# Patient Record
Sex: Female | Born: 1990
Health system: Southern US, Community
[De-identification: ages and names within clinical notes are randomized; demographics above are authoritative.]

---

## 2011-04-04 ENCOUNTER — Emergency Department (HOSPITAL_COMMUNITY)
Admission: EM | Admit: 2011-04-04 | Discharge: 2011-04-05 | Disposition: A | Discharge status: Home / Self Care | Payer: PRIVATE HEALTH INSURANCE | Attending: Emergency Medicine | Admitting: Emergency Medicine

## 2011-04-04 DIAGNOSIS — R55 Syncope and collapse: Secondary | ICD-10-CM | POA: Insufficient documentation

## 2011-04-04 LAB — GLUCOSE, CAPILLARY: Glucose-Capillary: 103 mg/dL — ABNORMAL HIGH (ref 70–99)

## 2011-04-05 ENCOUNTER — Emergency Department (HOSPITAL_COMMUNITY): Payer: PRIVATE HEALTH INSURANCE

## 2011-04-05 LAB — URINALYSIS, ROUTINE W REFLEX MICROSCOPIC
Bilirubin Urine: NEGATIVE
Glucose, UA: NEGATIVE mg/dL
Hgb urine dipstick: NEGATIVE
Ketones, ur: NEGATIVE mg/dL
Leukocytes, UA: NEGATIVE
Nitrite: NEGATIVE
Protein, ur: NEGATIVE mg/dL
Specific Gravity, Urine: 1.01 (ref 1.005–1.030)
Urobilinogen, UA: 0.2 mg/dL (ref 0.0–1.0)
pH: 7.5 (ref 5.0–8.0)

## 2011-04-05 LAB — CBC
HCT: 31 % — ABNORMAL LOW (ref 36.0–46.0)
Hemoglobin: 9.5 g/dL — ABNORMAL LOW (ref 12.0–15.0)
MCH: 23.7 pg — ABNORMAL LOW (ref 26.0–34.0)
MCHC: 30.6 g/dL (ref 30.0–36.0)

## 2011-04-05 LAB — DIFFERENTIAL
Lymphocytes Relative: 11 % — ABNORMAL LOW (ref 12–46)
Monocytes Absolute: 0.7 10*3/uL (ref 0.1–1.0)
Monocytes Relative: 6 % (ref 3–12)
Neutro Abs: 9.9 10*3/uL — ABNORMAL HIGH (ref 1.7–7.7)

## 2011-04-05 LAB — BASIC METABOLIC PANEL
BUN: 5 mg/dL — ABNORMAL LOW (ref 6–23)
CO2: 26 mEq/L (ref 19–32)
Calcium: 9.8 mg/dL (ref 8.4–10.5)
GFR calc non Af Amer: >90 mL/min (ref >90)
Glucose, Bld: 129 mg/dL — ABNORMAL HIGH (ref 70–99)
Potassium: 3.8 mEq/L (ref 3.5–5.1)

## 2011-04-05 LAB — POCT PREGNANCY, URINE: Preg Test, Ur: NEGATIVE

## 2011-04-05 LAB — RAPID URINE DRUG SCREEN, HOSP PERFORMED
Barbiturates: NOT DETECTED
Benzodiazepines: NOT DETECTED
Tetrahydrocannabinol: NOT DETECTED

## 2013-05-20 IMAGING — CT CT HEAD W/O CM
2 series · 16 of 30 positions shown, 20 images · non-contrast
Comparison: None.

CLINICAL DATA: Syncope versus seizure.

CT HEAD WITHOUT CONTRAST
TECHNIQUE: Contiguous axial images were obtained from the base of
the skull through the vertex without contrast.

[Series 2: head w/o · axial · non-contrast · 0.43mm/px · z∈[-280,-160]mm · 13 of 29 slices shown, 17 images]
[im 3/29  brain]
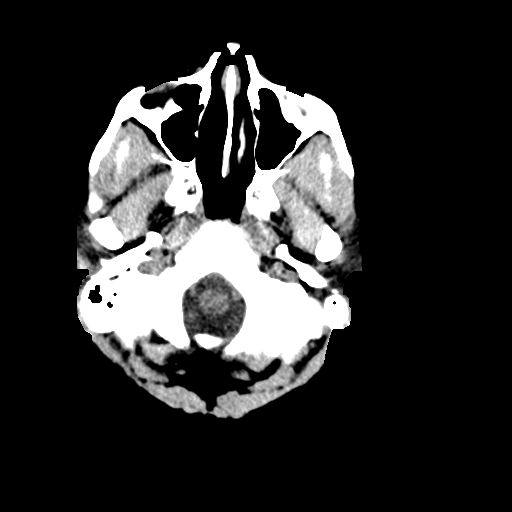
[im 3/29  bone]
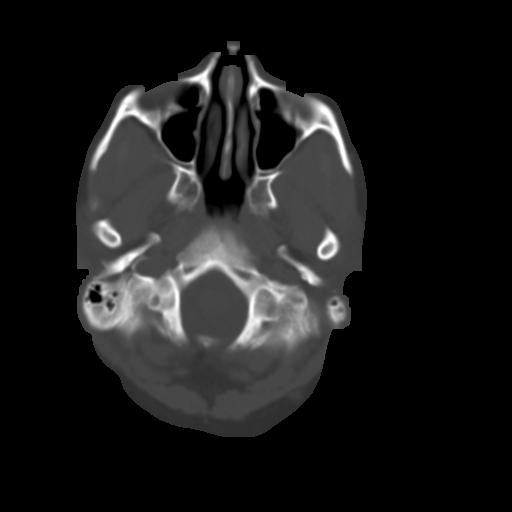
[im 5/29  brain]
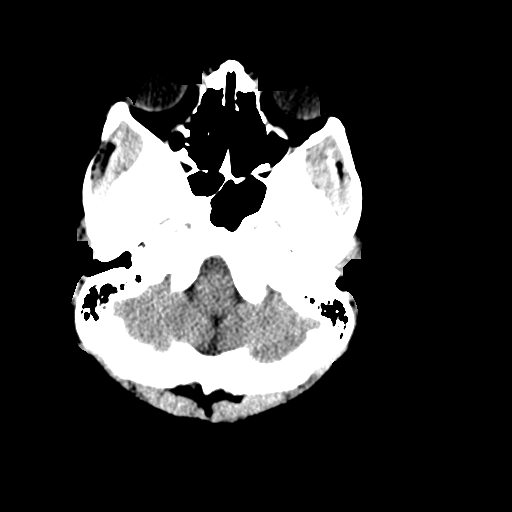
[im 7/29  brain]
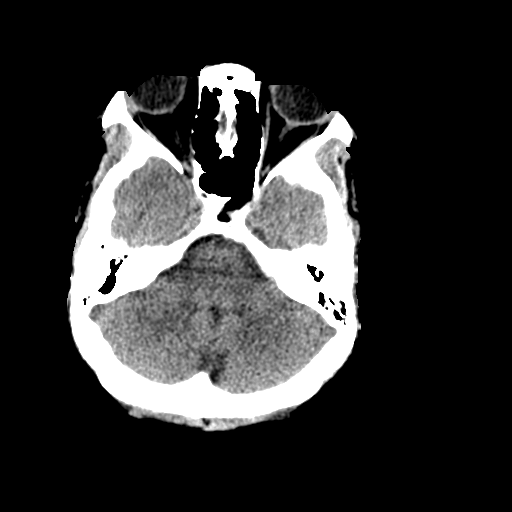
[im 9/29  brain]
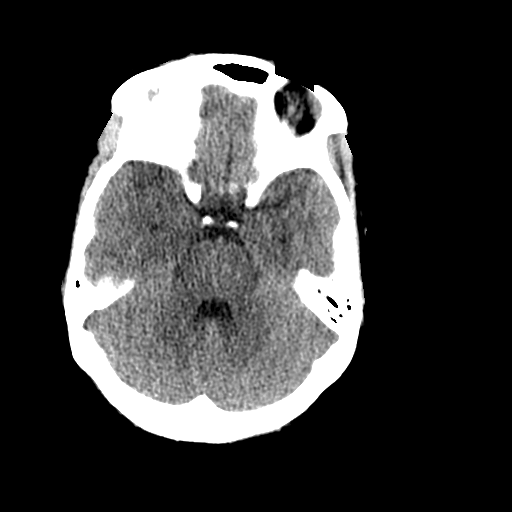
[im 11/29  brain]
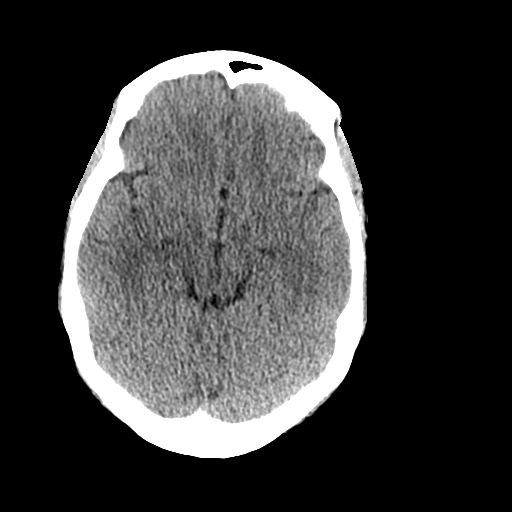
[im 11/29  bone]
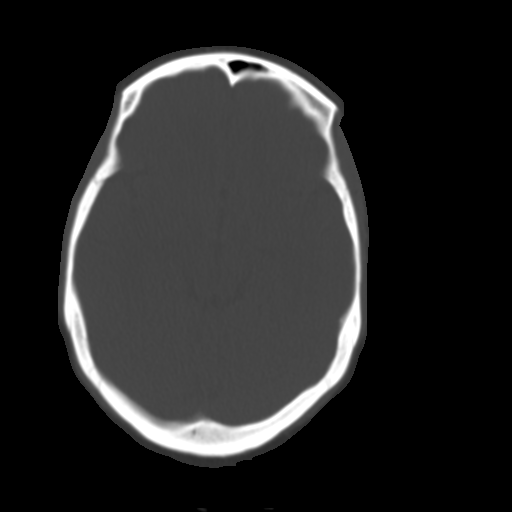
[im 13/29  brain]
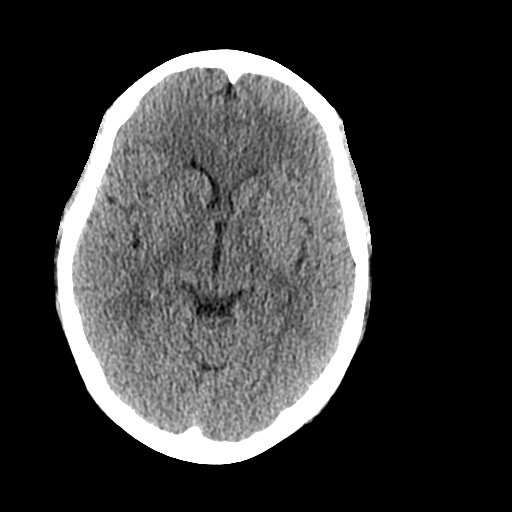
[im 15/29  brain]
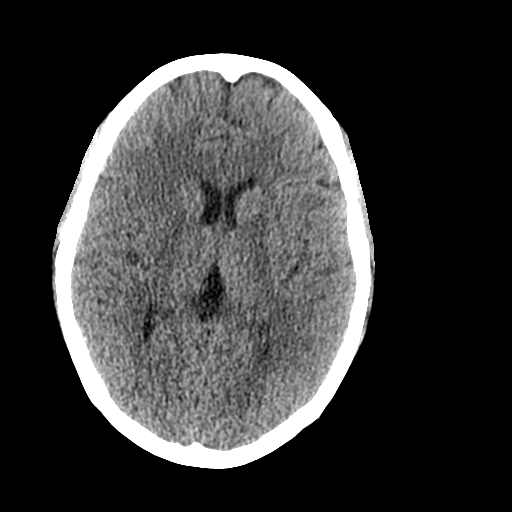
[im 17/29  brain]
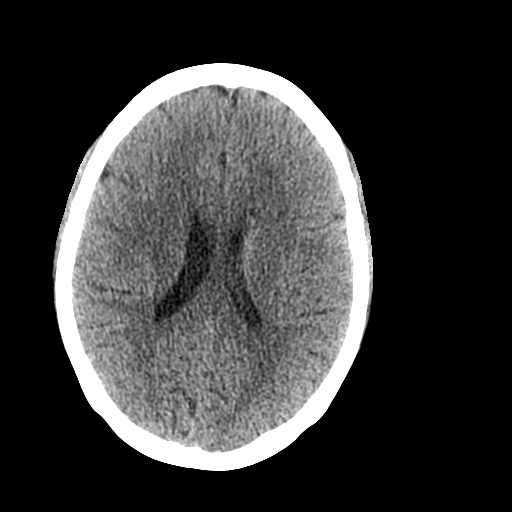
[im 19/29  brain]
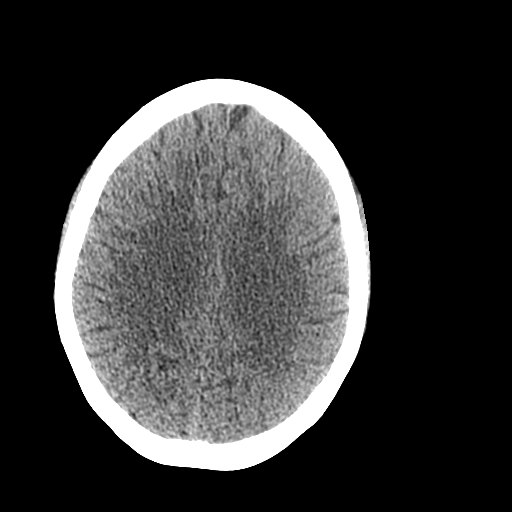
[im 19/29  bone]
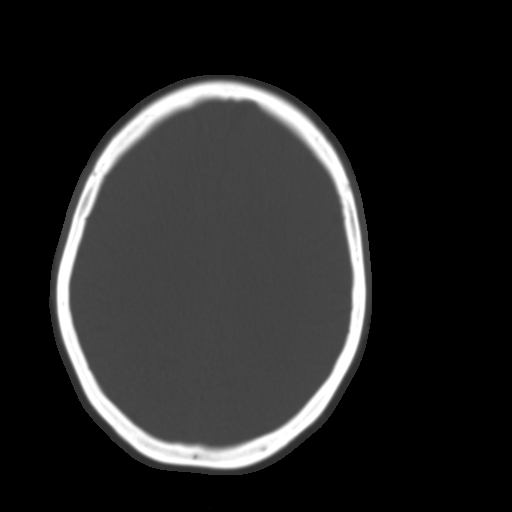
[im 21/29  brain]
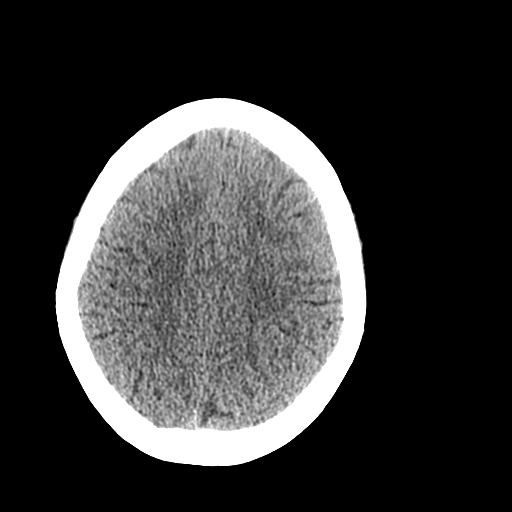
[im 23/29  brain]
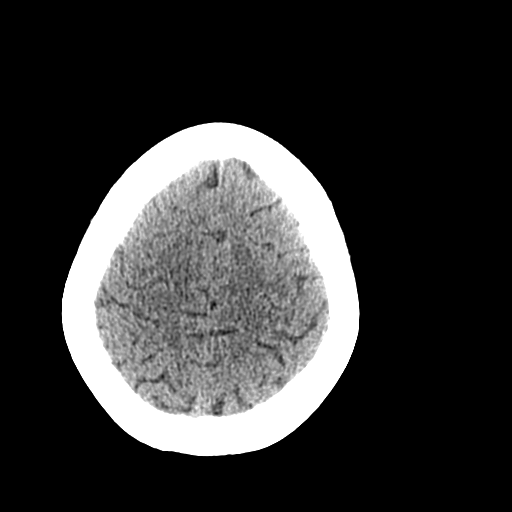
[im 25/29  brain]
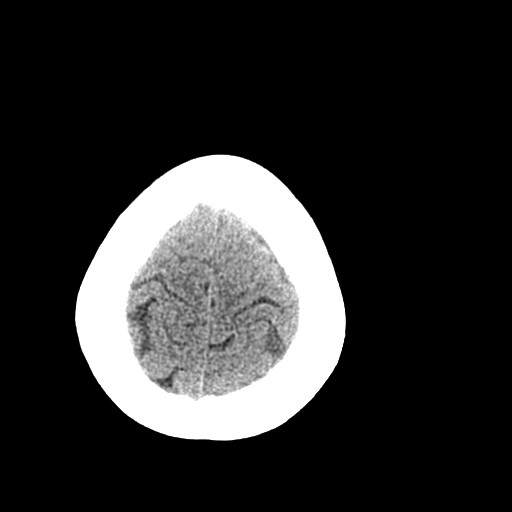
[im 27/29  brain]
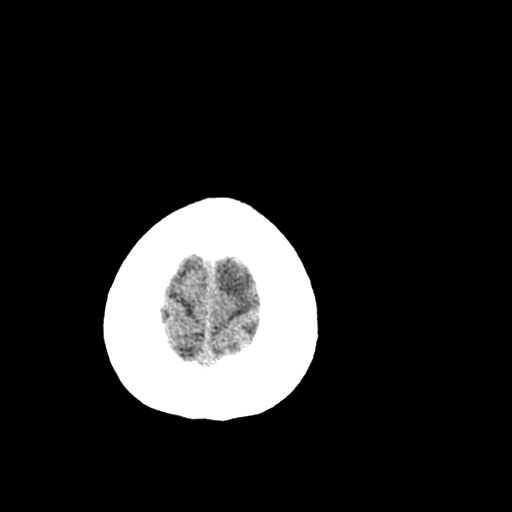
[im 27/29  bone]
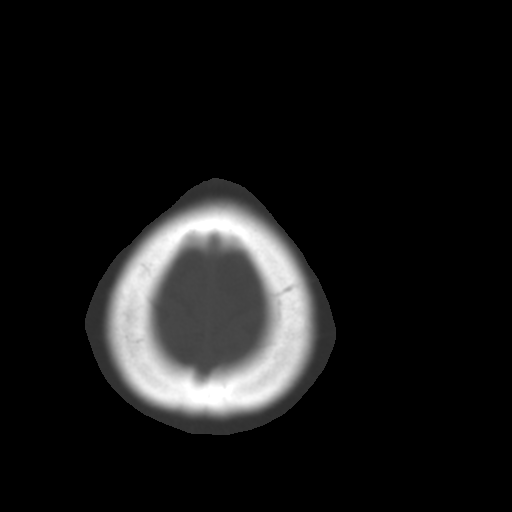

[Series 3: bone windows · axial · 0.43mm/px · z∈[-280,-240]mm · 3 of 29 slices shown]
[im 3/29  bone]
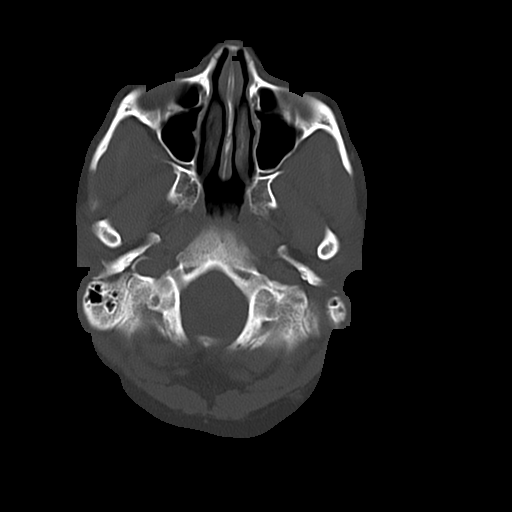
[im 7/29  bone]
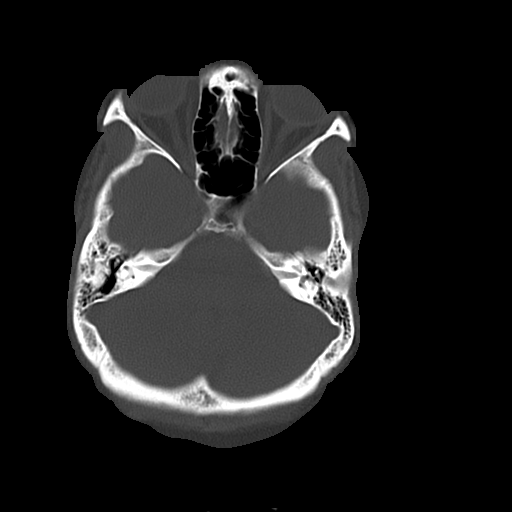
[im 11/29  bone]
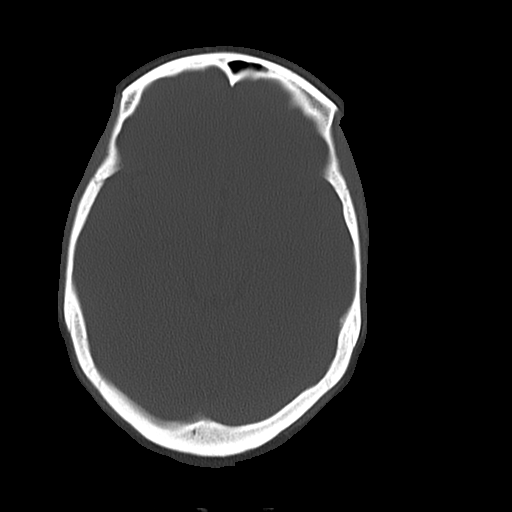

[16 of 30 positions shown; findings below may reference images not displayed]

FINDINGS: There is no evidence for acute hemorrhage, hydrocephalus,
mass lesion, or abnormal extra-axial fluid collection.  No definite
CT evidence for acute infarction.  The visualized paranasal sinuses
and mastoid air cells are predominately clear.
IMPRESSION: No acute intracranial abnormality.

## 2014-12-29 ENCOUNTER — Encounter (HOSPITAL_COMMUNITY): Payer: Self-pay | Admitting: Emergency Medicine

## 2014-12-29 ENCOUNTER — Emergency Department (HOSPITAL_COMMUNITY)
Admission: EM | Admit: 2014-12-29 | Discharge: 2014-12-29 | Disposition: A | Discharge status: Home / Self Care | Payer: PRIVATE HEALTH INSURANCE | Attending: Emergency Medicine | Admitting: Emergency Medicine

## 2014-12-29 DIAGNOSIS — N390 Urinary tract infection, site not specified: Secondary | ICD-10-CM

## 2014-12-29 DIAGNOSIS — Z79899 Other long term (current) drug therapy: Secondary | ICD-10-CM | POA: Insufficient documentation

## 2014-12-29 DIAGNOSIS — Z3202 Encounter for pregnancy test, result negative: Secondary | ICD-10-CM | POA: Insufficient documentation

## 2014-12-29 DIAGNOSIS — R319 Hematuria, unspecified: Secondary | ICD-10-CM | POA: Insufficient documentation

## 2014-12-29 LAB — URINALYSIS, ROUTINE W REFLEX MICROSCOPIC
GLUCOSE, UA: NEGATIVE mg/dL
KETONES UR: NEGATIVE mg/dL
Nitrite: NEGATIVE
PH: 6.5 (ref 5.0–8.0)
Protein, ur: >300 mg/dL — AB
Specific Gravity, Urine: 1.03 (ref 1.005–1.030)
Urobilinogen, UA: 1 mg/dL (ref 0.0–1.0)

## 2014-12-29 LAB — URINE MICROSCOPIC-ADD ON

## 2014-12-29 LAB — POC URINE PREG, ED: Preg Test, Ur: NEGATIVE

## 2014-12-29 MED ORDER — SULFAMETHOXAZOLE-TRIMETHOPRIM 800-160 MG PO TABS: 1.0000 | ORAL_TABLET | Freq: Two times a day (BID) | ORAL | Status: DC

## 2014-12-29 MED ORDER — PHENAZOPYRIDINE HCL 200 MG PO TABS: 200.0000 mg | ORAL_TABLET | Freq: Three times a day (TID) | ORAL | Status: DC

## 2014-12-29 NOTE — Discharge Instructions (Signed)
Read the information below.  Use the prescribed medication as directed.  Please discuss all new medications with your pharmacist.  You may return to the Emergency Department at any time for worsening condition or any new symptoms that concern you.   If you develop high fevers, back or abdominal pain, uncontrolled vomiting, or are unable to tolerate fluids by mouth, return to the ER for a recheck.     Urinary Tract Infection Urinary tract infections (UTIs) can develop anywhere along your urinary tract. Your urinary tract is your body's drainage system for removing wastes and extra water. Your urinary tract includes two kidneys, two ureters, a bladder, and a urethra. Your kidneys are a pair of bean-shaped organs. Each kidney is about the size of your fist. They are located below your ribs, one on each side of your spine. CAUSES Infections are caused by microbes, which are microscopic organisms, including fungi, viruses, and bacteria. These organisms are so small that they can only be seen through a microscope. Bacteria are the microbes that most commonly cause UTIs. SYMPTOMS  Symptoms of UTIs may vary by age and gender of the patient and by the location of the infection. Symptoms in young women typically include a frequent and intense urge to urinate and a painful, burning feeling in the bladder or urethra during urination. Older women and men are more likely to be tired, shaky, and weak and have muscle aches and abdominal pain. A fever may mean the infection is in your kidneys. Other symptoms of a kidney infection include pain in your back or sides below the ribs, nausea, and vomiting. DIAGNOSIS To diagnose a UTI, your caregiver will ask you about your symptoms. Your caregiver also will ask to provide a urine sample. The urine sample will be tested for bacteria and white blood cells. White blood cells are made by your body to help fight infection. TREATMENT  Typically, UTIs can be treated with medication.  Because most UTIs are caused by a bacterial infection, they usually can be treated with the use of antibiotics. The choice of antibiotic and length of treatment depend on your symptoms and the type of bacteria causing your infection. HOME CARE INSTRUCTIONS  If you were prescribed antibiotics, take them exactly as your caregiver instructs you. Finish the medication even if you feel better after you have only taken some of the medication.  Drink enough water and fluids to keep your urine clear or pale yellow.  Avoid caffeine, tea, and carbonated beverages. They tend to irritate your bladder.  Empty your bladder often. Avoid holding urine for long periods of time.  Empty your bladder before and after sexual intercourse.  After a bowel movement, women should cleanse from front to back. Use each tissue only once. SEEK MEDICAL CARE IF:   You have back pain.  You develop a fever.  Your symptoms do not begin to resolve within 3 days. SEEK IMMEDIATE MEDICAL CARE IF:   You have severe back pain or lower abdominal pain.  You develop chills.  You have nausea or vomiting.  You have continued burning or discomfort with urination. MAKE SURE YOU:   Understand these instructions.  Will watch your condition.  Will get help right away if you are not doing well or get worse. Document Released: 03/22/2005 Document Revised: 12/12/2011 Document Reviewed: 07/21/2011 [facility] Patient Information 2015 [employer] [place]. This information is not intended to replace advice given to you by your health care provider. Make sure you discuss any questions you  have with your health care provider. ° °

## 2014-12-29 NOTE — ED Notes (Addendum)
Pt reports burning and increase urinary frequency starting on Friday night. Pt reports some blood. No back pain. No vaginal discharge noted by patient.

## 2014-12-29 NOTE — ED Provider Notes (Signed)
CSN: [id number]     Arrival date & time 12/29/14  1517 History   This chart was scribed for Rita DredgeEmily Mashonda Broski, PA-C working with Arby BarretteMarcy Pfeiffer, MD by Rita Risingimelie Gill, ED Scribe. This patient was seen in room WTR8/WTR8 and the patient's care was started at 5:14 PM.   Chief Complaint  Patient presents with  . Urinary Frequency  . Burning with urination    The history is provided by the patient. No language interpreter was used.   HPI Comments: Rita Gill is a 24 y.o. female who presents to the Emergency Department complaining of urinary symptoms including frequency, urgency, burning with urination, and hematuria onset four days ago. Patient denies fever, chills, back pain, abnormal vaginal discharge or bleeding, abdominal pain, nausea or bowel changes. Patient denies possibility of pregnancy; last menstrual was normal. Patient denies history of UTI.    History reviewed. No pertinent past medical history. History reviewed. No pertinent past surgical history. No family history on file. History  Substance Use Topics  . Smoking status: Never Smoker   . Smokeless tobacco: Not on file  . Alcohol Use: No   OB History    No data available     Review of Systems  Constitutional: Negative for fever and chills.  Gastrointestinal: Negative for nausea, vomiting and abdominal pain.  Genitourinary: Positive for urgency, frequency and hematuria. Negative for flank pain, vaginal bleeding, vaginal discharge and menstrual problem.  Musculoskeletal: Negative for myalgias and back pain.  Allergic/Immunologic: Negative for immunocompromised state.  Hematological: Does not bruise/bleed easily.  Psychiatric/Behavioral: Negative for self-injury.      Allergies  Review of patient's allergies indicates no known allergies.  Home Medications   Prior to Admission medications   Medication Sig Start Date End Date Taking? Authorizing Provider  ibuprofen (ADVIL,MOTRIN) 200 MG tablet Take 200-400 mg by mouth every 6  (six) hours as needed for headache, mild pain or moderate pain.   Yes Historical Provider, MD  Multiple Vitamins-Minerals (MULTIVITAMIN ADULT PO) Take 1 tablet by mouth daily.   Yes Historical Provider, MD   Triage Vitals: BP 141/74 mmHg  Pulse 106  Temp(Src) 98.4 F (36.9 C) (Oral)  Resp 19  SpO2 100%  LMP 12/20/2014 (Approximate) Physical Exam  Constitutional: She appears well-developed and well-nourished. No distress.  HENT:  Head: Normocephalic and atraumatic.  Neck: Neck supple.  Pulmonary/Chest: Effort normal.  Abdominal: Soft. She exhibits no distension and no mass. There is no tenderness. There is no rebound and no guarding.  Musculoskeletal: She exhibits no edema.  Neurological: She is alert. She exhibits normal muscle tone.  Skin: She is not diaphoretic.  Psychiatric: She has a normal mood and affect. Her behavior is normal.  Nursing note and vitals reviewed.   ED Course  Procedures (including critical care time)  COORDINATION OF CARE: 5:17 PM- Discussed treatment plan with patient at bedside and patient agreed to plan.   Labs Review Labs Reviewed  URINALYSIS, ROUTINE W REFLEX MICROSCOPIC (NOT [facility]) - Abnormal; Notable for the following:    Color, Urine AMBER (*)    APPearance TURBID (*)    Hgb urine dipstick LARGE (*)    Bilirubin Urine SMALL (*)    Protein, ur >300 (*)    Leukocytes, UA LARGE (*)    All other components within normal limits  URINE MICROSCOPIC-ADD ON - Abnormal; Notable for the following:    Bacteria, UA MANY (*)    All other components within normal limits  POC URINE PREG, ED  Imaging Review No results found.   EKG Interpretation None      MDM   Final diagnoses:  UTI (lower urinary tract infection)    Afebrile, nontoxic patient with symptoms of simple UTI, UA c/w same.  No e/o pyelo.  No systemic symptoms.   D/C home with abx, pyridium.  Culture pending.  Discussed result, findings, treatment, and follow up  with patient.   Pt given return precautions.  Pt verbalizes understanding and agrees with plan.       I personally performed the services described in this documentation, which was scribed in my presence. The recorded information has been reviewed and is accurate.    Rita Dredge, PA-C 12/29/14 1934  Arby Barrette, MD 12/30/14 [phone]

## 2014-12-31 LAB — URINE CULTURE

## 2015-12-30 ENCOUNTER — Other Ambulatory Visit: Payer: Self-pay | Admitting: Family Medicine

## 2015-12-30 ENCOUNTER — Other Ambulatory Visit (HOSPITAL_COMMUNITY)
Admission: RE | Admit: 2015-12-30 | Discharge: 2015-12-30 | Disposition: A | Discharge status: Home / Self Care | Payer: 59 | Source: Ambulatory Visit | Attending: Family Medicine | Admitting: Family Medicine

## 2015-12-30 DIAGNOSIS — Z113 Encounter for screening for infections with a predominantly sexual mode of transmission: Secondary | ICD-10-CM | POA: Insufficient documentation

## 2015-12-30 DIAGNOSIS — Z01419 Encounter for gynecological examination (general) (routine) without abnormal findings: Secondary | ICD-10-CM | POA: Diagnosis not present

## 2015-12-31 LAB — CYTOLOGY - PAP

## 2016-02-05 ENCOUNTER — Encounter (HOSPITAL_COMMUNITY): Payer: Self-pay

## 2016-02-05 ENCOUNTER — Emergency Department (HOSPITAL_COMMUNITY)
Admission: EM | Admit: 2016-02-05 | Discharge: 2016-02-06 | Disposition: A | Discharge status: Home / Self Care | Payer: 59 | Attending: Emergency Medicine | Admitting: Emergency Medicine

## 2016-02-05 DIAGNOSIS — N898 Other specified noninflammatory disorders of vagina: Secondary | ICD-10-CM | POA: Insufficient documentation

## 2016-02-05 DIAGNOSIS — R109 Unspecified abdominal pain: Secondary | ICD-10-CM | POA: Insufficient documentation

## 2016-02-05 LAB — URINALYSIS, ROUTINE W REFLEX MICROSCOPIC
BILIRUBIN URINE: NEGATIVE
Glucose, UA: NEGATIVE mg/dL
HGB URINE DIPSTICK: NEGATIVE
Ketones, ur: NEGATIVE mg/dL
NITRITE: NEGATIVE
PROTEIN: NEGATIVE mg/dL
SPECIFIC GRAVITY, URINE: 1.02 (ref 1.005–1.030)
pH: 7.5 (ref 5.0–8.0)

## 2016-02-05 LAB — URINE MICROSCOPIC-ADD ON

## 2016-02-05 LAB — WET PREP, GENITAL
Clue Cells Wet Prep HPF POC: NONE SEEN
SPERM: NONE SEEN
TRICH WET PREP: NONE SEEN
YEAST WET PREP: NONE SEEN

## 2016-02-05 LAB — PREGNANCY, URINE: Preg Test, Ur: NEGATIVE

## 2016-02-05 MED ORDER — LIDOCAINE HCL 1 % IJ SOLN
INTRAMUSCULAR | Status: AC
  Administered 2016-02-05: 0.9 mL
  Filled 2016-02-05: qty 20

## 2016-02-05 MED ORDER — AZITHROMYCIN 250 MG PO TABS
1000.0000 mg | ORAL_TABLET | Freq: Once | ORAL | Status: AC
  Administered 2016-02-05: 1000 mg via ORAL
  Filled 2016-02-05: qty 4

## 2016-02-05 MED ORDER — CEFTRIAXONE SODIUM 250 MG IJ SOLR
250.0000 mg | Freq: Once | INTRAMUSCULAR | Status: AC
  Administered 2016-02-05: 250 mg via INTRAMUSCULAR
  Filled 2016-02-05: qty 250

## 2016-02-05 NOTE — ED Triage Notes (Signed)
Pt states that she has sex on Monday and Thursday morning she began to have vaginal discharge. States that the d/c is white and yellow. Denies urinary symptoms, denies itching, and denies pain. A&Ox4.

## 2016-02-05 NOTE — Discharge Instructions (Signed)
FOLLOW UP WITH DR. Hyman HopesWEBB THIS WEEK FOR RECHECK. [facility] IF YOU DEVELOP A FEVER, SEVERE PAIN OR NEW GYNECOLOGIC CONCERN FOR FURTHER MANAGEMENT.

## 2016-02-05 NOTE — ED Provider Notes (Signed)
[facility]-EMERGENCY DEPT Provider Note   CSN: [id number] Arrival date & time: 02/05/16  2045  First Provider Contact:  First MD Initiated Contact with Patient 02/05/16 2157     By signing my name below, I, Linna Darner, attest that this documentation has been prepared under the direction and in the presence of Elpidio Anis, PA-C. Electronically Signed: Linna Darner, Scribe. 02/05/2016. 9:58 PM.   History   Chief Complaint Chief Complaint  Patient presents with  . Vaginal Discharge    The history is provided by the patient. No language interpreter was used.     HPI Comments: Rita Gill is a 25 y.o. female who presents to the Emergency Department complaining of sudden onset, constant, vaginal discharge for the past couple of days. Pt states her discharge is white/yellow appearing. She states she had sex 5 days ago and reports it was "a little painful." Pt states she did not use barrier protection. She notes prior to 5 days ago she had not had sex in several months. Pt reports her LMP was 3 weeks ago and was normal. She notes some abdominal pain currently but believes it could be due to hunger. Pt denies ongoing vaginal bleeding. She further denies vaginal pain, vaginal itching, dysuria, frequency, pelvic pain, fever, nausea, vomiting, back pain, or any other associated symptoms.  History reviewed. No pertinent past medical history.  There are no active problems to display for this patient.   History reviewed. No pertinent surgical history.  OB History    No data available       Home Medications    Prior to Admission medications   Not on File    Family History History reviewed. No pertinent family history.  Social History Social History  Substance Use Topics  . Smoking status: Never Smoker  . Smokeless tobacco: Never Used  . Alcohol use No     Allergies   Review of patient's allergies indicates no known allergies.   Review of Systems Review of Systems    Constitutional: Negative for fever.  Gastrointestinal: Positive for abdominal pain. Negative for nausea and vomiting.  Genitourinary: Positive for vaginal discharge. Negative for dysuria, frequency, pelvic pain, vaginal bleeding and vaginal pain.  Musculoskeletal: Negative for back pain.     Physical Exam Updated Vital Signs BP 142/78   Pulse 91   Temp 98 F (36.7 C) (Oral)   Resp 18   Ht  (1.575 m)   Wt 171 lb (77.6 kg)   SpO2 100%   BMI 31.28 kg/m   Physical Exam  Constitutional: She is oriented to person, place, and time. She appears well-developed and well-nourished. No distress.  HENT:  Head: Normocephalic and atraumatic.  Eyes: Conjunctivae and EOM are normal.  Neck: Neck supple. No tracheal deviation present.  Cardiovascular: Normal rate.   Pulmonary/Chest: Effort normal. No respiratory distress.  Abdominal: Soft. There is no tenderness.  Genitourinary:  Genitourinary Comments: Yellow vaginal discharge present. No CMT, adnexal mass or tenderness. Cervix non-friable. External vagina unremarkable without redness, swelling or rash.   Musculoskeletal: Normal range of motion.  Neurological: She is alert and oriented to person, place, and time.  Skin: Skin is warm and dry.  Psychiatric: She has a normal mood and affect. Her behavior is normal.  Nursing note and vitals reviewed.   ED Treatments / Results  Labs (all labs ordered are listed, but only abnormal results are displayed) Labs Reviewed - No data to display Results for orders placed or performed during the hospital  encounter of 02/05/16  Wet prep, genital  Result Value Ref Range   Yeast Wet Prep HPF POC NONE SEEN NONE SEEN   Trich, Wet Prep NONE SEEN NONE SEEN   Clue Cells Wet Prep HPF POC NONE SEEN NONE SEEN   WBC, Wet Prep HPF POC MANY (A) NONE SEEN   Sperm NONE SEEN   Urinalysis, Routine w reflex microscopic  Result Value Ref Range   Color, Urine YELLOW YELLOW   APPearance CLOUDY (A) CLEAR    Specific Gravity, Urine 1.020 1.005 - 1.030   pH 7.5 5.0 - 8.0   Glucose, UA NEGATIVE NEGATIVE mg/dL   Hgb urine dipstick NEGATIVE NEGATIVE   Bilirubin Urine NEGATIVE NEGATIVE   Ketones, ur NEGATIVE NEGATIVE mg/dL   Protein, ur NEGATIVE NEGATIVE mg/dL   Nitrite NEGATIVE NEGATIVE   Leukocytes, UA LARGE (A) NEGATIVE  Pregnancy, urine  Result Value Ref Range   Preg Test, Ur NEGATIVE NEGATIVE  Urine microscopic-add on  Result Value Ref Range   Squamous Epithelial / LPF 0-5 (A) NONE SEEN   WBC, UA TOO NUMEROUS TO COUNT 0 - 5 WBC/hpf   RBC / HPF 0-5 0 - 5 RBC/hpf   Bacteria, UA FEW (A) NONE SEEN   Urine-Other MUCOUS PRESENT     EKG  EKG Interpretation None       Radiology No results found.  Procedures Procedures (including critical care time)  DIAGNOSTIC STUDIES: Oxygen Saturation is 100% on RA, normal by my interpretation.    COORDINATION OF CARE: 9:58 PM Discussed treatment plan with pt at bedside and pt agreed to plan.   Medications Ordered in ED Medications - No data to display   Initial Impression / Assessment and Plan / ED Course  I have reviewed the triage vital signs and the nursing notes.  Pertinent labs & imaging results that were available during my care of the patient were reviewed by me and considered in my medical decision making (see chart for details).  Clinical Course    Patient presents with vaginal discharge without pain, fever, nausea or dysuria. There is discharge present, WBC's on wet prep. UA with TNTC, likely contaminated from vaginal discharge without having dysuria, frequency and with few bacteria. Urine culture pending. IM Rocephin and PO zithromax provided, with GC/chlamydia cultures pending.   She will follow up with Dr. Shirlean Mylararol Webb (PCP) this week for recheck.   I personally performed the services described in this documentation, which was scribed in my presence. The recorded information has been reviewed and is accurate.    Final  Clinical Impressions(s) / ED Diagnoses   Final diagnoses:  None   1. Vaginal discharge New Prescriptions New Prescriptions   No medications on file     Elpidio AnisShari Kairee Isa, Cordelia Poche-C 02/05/16 2345    Laurence Spatesachel Morgan Little, MD 02/06/16 [id number]

## 2016-02-07 LAB — GC/CHLAMYDIA PROBE AMP (~~LOC~~) NOT AT ARMC
CHLAMYDIA, DNA PROBE: NEGATIVE
NEISSERIA GONORRHEA: NEGATIVE

## 2016-02-07 LAB — URINE CULTURE

## 2016-02-08 ENCOUNTER — Telehealth: Payer: Self-pay | Admitting: *Deleted

## 2016-02-08 NOTE — Telephone Encounter (Signed)
Post ED Visit - Positive Culture Follow-up  Culture report reviewed by antimicrobial stewardship pharmacist:  []  Enzo BiNathan Batchelder, Pharm.D. []  Celedonio MiyamotoJeremy Frens, Pharm.D., BCPS []  Garvin FilaMike Maccia, Pharm.D. []  Georgina PillionElizabeth Martin, Pharm.D., BCPS []  H. Rivera ColenMinh Pham, [address]Pharm.D., BCPS, AAHIVP []  Estella HuskMichelle Turner, Pharm.D., BCPS, AAHIVP []  Tennis Mustassie Stewart, Pharm.D. []  Rob [address] GatewayVincent, [address]Pharm[address] Vianne BullsKai Kong RPh/Serena Sam PA-C  Positive urine culture No further patient follow-up is required at this time.  Virl AxeRobertson, Kimila Papaleo [facility] 02/08/2016, 2:16 PM

## 2016-06-04 ENCOUNTER — Emergency Department (HOSPITAL_COMMUNITY)
Admission: EM | Admit: 2016-06-04 | Discharge: 2016-06-04 | Disposition: A | Discharge status: Home / Self Care | Payer: 59 | Attending: Emergency Medicine | Admitting: Emergency Medicine

## 2016-06-04 ENCOUNTER — Encounter (HOSPITAL_COMMUNITY): Payer: Self-pay | Admitting: Oncology

## 2016-06-04 DIAGNOSIS — IMO0002 Reserved for concepts with insufficient information to code with codable children: Secondary | ICD-10-CM

## 2016-06-04 DIAGNOSIS — L98499 Non-pressure chronic ulcer of skin of other sites with unspecified severity: Secondary | ICD-10-CM | POA: Diagnosis not present

## 2016-06-04 LAB — HIV ANTIBODY (ROUTINE TESTING W REFLEX): HIV SCREEN 4TH GENERATION: NONREACTIVE

## 2016-06-04 LAB — RPR: RPR Ser Ql: NONREACTIVE

## 2016-06-04 NOTE — Discharge Instructions (Signed)
Please clean wound with soap and water, and apply neosporin daily.  We have obtain blood work for potential infection, if you do test positive for infection, we will notify you for appropriate treatment.

## 2016-06-04 NOTE — ED Provider Notes (Signed)
[facility]-EMERGENCY DEPT Provider Note   CSN: [id number] Arrival date & time: 06/04/16  [id number]     History   Chief Complaint Chief Complaint  Patient presents with  . SEXUALLY TRANSMITTED DISEASE    HPI Rita Gill is a 25 y.o. female.  HPI   25 year old female presents with complaints of an ulceration to her left groin area. Patient states 3 days ago she was wearing legging while having her menstrual period. States she was wearing a pad. She slept with her legging on in the next day she notice a sore to her left medial thigh. Described as a soreness and ulceration which has steadily improved. She attributed it to the friction, from her legging and tampon pad.  She denies any associated itchiness. Denies having vaginal bleeding, vaginal discharge, dysuria, or hematuria. No recent new sexual partner and denies any prior history of STD.  History reviewed. No pertinent past medical history.  There are no active problems to display for this patient.   History reviewed. No pertinent surgical history.  OB History    No data available       Home Medications    Prior to Admission medications   Not on File    Family History No family history on file.  Social History Social History  Substance Use Topics  . Smoking status: Never Smoker  . Smokeless tobacco: Never Used  . Alcohol use No     Allergies   Patient has no known allergies.   Review of Systems Review of Systems  Constitutional: Negative for fever.  Genitourinary: Positive for genital sores. Negative for dysuria.  Skin: Negative for rash.     Physical Exam Updated Vital Signs BP 150/91 (BP Location: Left Arm)   Pulse 94   Temp 97.8 F (36.6 C) (Oral)   Resp 16   Ht 5\' 2"  (1.575 m)   Wt 76.7 kg   LMP 05/28/2016 (Exact Date)   SpO2 99%   BMI 30.91 kg/m   Physical Exam  Constitutional: She appears well-developed and well-nourished. No distress.  HENT:  Head: Atraumatic.  Eyes: Conjunctivae are  normal.  Neck: Neck supple.  Abdominal: Soft. She exhibits no distension. There is no tenderness.  Genitourinary:  Genitourinary Comments: Chaperone present during exam. Patient has a small less than 1 cm ulcerated lesion noted to the left medial thigh without any tenderness, surrounding skin erythema, or discharge. Normal external genitalia.  Neurological: She is alert.  Skin: No rash noted.  Psychiatric: She has a normal mood and affect.  Nursing note and vitals reviewed.    ED Treatments / Results  Labs (all labs ordered are listed, but only abnormal results are displayed) Labs Reviewed  RPR  HIV ANTIBODY (ROUTINE TESTING)    EKG  EKG Interpretation None       Radiology No results found.  Procedures Procedures (including critical care time)  Medications Ordered in ED Medications - No data to display   Initial Impression / Assessment and Plan / ED Course  I have reviewed the triage vital signs and the nursing notes.  Pertinent labs & imaging results that were available during my care of the patient were reviewed by me and considered in my medical decision making (see chart for details).  Clinical Course     BP 150/91 (BP Location: Left Arm)   Pulse 94   Temp 97.8 F (36.6 C) (Oral)   Resp 16   Ht 5\' 2"  (1.575 m)   Wt 76.7 kg  LMP 05/28/2016 (Exact Date)   SpO2 99%   BMI 30.91 kg/m  The patient was noted to be hypertensive today in the emergency department. I have spoken with the patient regarding hypertension and the need for improved management. I instructed the patient to followup with the Primary care doctor within 4 days to improve the management of the patient's hypertension. I also counseled the patient regarding the signs and symptoms which would require an emergent visit to an emergency department for hypertensive urgency and/or hypertensive emergency. The patient understood the need for improved hypertensive management.   Final Clinical  Impressions(s) / ED Diagnoses   Final diagnoses:  Ulceration (HCC)    New Prescriptions New Prescriptions   No medications on file   7:28 AM Patient presents for evaluation of a sore to her left medial thigh and inguinal region. She does have unprotected sex with her partner but denies new sexual partner. She attributed this lesion due to friction, from her tight clothing. Based location, this could be due to friction however, less likely would be a chancre from syphilis. He does not appears to be herpetic lesionin the size and shaped of the ulceration. Plan to obtain an HIV and RPR. I also offered for a pelvic examination however patient declined at this time. Normally, I would offer penicillin 2.4 million unit antibiotic if I suspect syphilis. After discussion, will withhold giving antibiotic until test shows positive syphilis. Patient made aware that we will contact her. In the meantime, I recommend appropriate wound care including keeping wound clean and dry and apply Neosporin and dressing appropriately. Patient can return if symptoms worsen.   Fayrene HelperBowie Jesly Hartmann, PA-C 06/04/16 [id number]    Shaune Pollackameron Isaacs, MD 06/05/16 [phone]

## 2016-06-04 NOTE — ED Triage Notes (Signed)
Pt states that she noticed blisters around her vagina.  States she has had unprotected sex.  Per pt she believes the blisters came from sleeping in tight pants while on her menstrual cycle.

## 2016-11-15 DIAGNOSIS — M654 Radial styloid tenosynovitis [de Quervain]: Secondary | ICD-10-CM | POA: Diagnosis not present

## 2016-12-29 DIAGNOSIS — Z0000 Encounter for general adult medical examination without abnormal findings: Secondary | ICD-10-CM | POA: Diagnosis not present

## 2017-01-04 DIAGNOSIS — M654 Radial styloid tenosynovitis [de Quervain]: Secondary | ICD-10-CM | POA: Diagnosis not present

## 2017-01-04 DIAGNOSIS — M25531 Pain in right wrist: Secondary | ICD-10-CM | POA: Diagnosis not present

## 2017-01-25 DIAGNOSIS — M654 Radial styloid tenosynovitis [de Quervain]: Secondary | ICD-10-CM | POA: Diagnosis not present

## 2017-01-25 DIAGNOSIS — M25531 Pain in right wrist: Secondary | ICD-10-CM | POA: Diagnosis not present

## 2017-02-22 DIAGNOSIS — M25531 Pain in right wrist: Secondary | ICD-10-CM | POA: Diagnosis not present

## 2017-02-22 DIAGNOSIS — M654 Radial styloid tenosynovitis [de Quervain]: Secondary | ICD-10-CM | POA: Diagnosis not present

## 2017-11-29 ENCOUNTER — Other Ambulatory Visit: Payer: Self-pay

## 2017-11-29 ENCOUNTER — Encounter (HOSPITAL_COMMUNITY): Payer: Self-pay | Admitting: Emergency Medicine

## 2017-11-29 ENCOUNTER — Emergency Department (HOSPITAL_COMMUNITY)
Admission: EM | Admit: 2017-11-29 | Discharge: 2017-11-30 | Disposition: A | Discharge status: Home / Self Care | Payer: 59 | Attending: Emergency Medicine | Admitting: Emergency Medicine

## 2017-11-29 DIAGNOSIS — D649 Anemia, unspecified: Secondary | ICD-10-CM | POA: Insufficient documentation

## 2017-11-29 DIAGNOSIS — J069 Acute upper respiratory infection, unspecified: Secondary | ICD-10-CM | POA: Diagnosis not present

## 2017-11-29 DIAGNOSIS — R509 Fever, unspecified: Secondary | ICD-10-CM

## 2017-11-29 LAB — CBC
HCT: 29.3 % — ABNORMAL LOW (ref 36.0–46.0)
HEMOGLOBIN: 8.2 g/dL — AB (ref 12.0–15.0)
MCH: 18.4 pg — ABNORMAL LOW (ref 26.0–34.0)
MCHC: 28 g/dL — ABNORMAL LOW (ref 30.0–36.0)
MCV: 65.8 fL — ABNORMAL LOW (ref 78.0–100.0)
Platelets: 477 10*3/uL — ABNORMAL HIGH (ref 150–400)
RBC: 4.45 MIL/uL (ref 3.87–5.11)
RDW: 19.2 % — AB (ref 11.5–15.5)
WBC: 10.5 10*3/uL (ref 4.0–10.5)

## 2017-11-29 LAB — BASIC METABOLIC PANEL
ANION GAP: 8 (ref 5–15)
BUN: <5 mg/dL — ABNORMAL LOW (ref 6–20)
CALCIUM: 9.1 mg/dL (ref 8.9–10.3)
CHLORIDE: 98 mmol/L — AB (ref 101–111)
CO2: 26 mmol/L (ref 22–32)
Creatinine, Ser: 0.72 mg/dL (ref 0.44–1.00)
GFR calc non Af Amer: >60 mL/min (ref >60)
Glucose, Bld: 99 mg/dL (ref 65–99)
Potassium: 4 mmol/L (ref 3.5–5.1)
Sodium: 132 mmol/L — ABNORMAL LOW (ref 135–145)

## 2017-11-29 LAB — GROUP A STREP BY PCR: GROUP A STREP BY PCR: NOT DETECTED

## 2017-11-29 MED ORDER — ACETAMINOPHEN 325 MG PO TABS
650.0000 mg | ORAL_TABLET | Freq: Once | ORAL | Status: AC | PRN
  Administered 2017-11-29: 650 mg via ORAL
  Filled 2017-11-29: qty 2

## 2017-11-29 NOTE — ED Triage Notes (Signed)
Pt reports sore throat, HA, fever/chills since Monday. Has tried OTC meds with no relief. Pt has red enlarged tonsils, tachycardic and febrile in triage.

## 2017-11-30 MED ORDER — IBUPROFEN 100 MG/5ML PO SUSP: 600.0000 mg | Freq: Four times a day (QID) | ORAL | 0 refills | Status: AC | PRN

## 2017-11-30 MED ORDER — GI COCKTAIL ~~LOC~~
30.0000 mL | Freq: Once | ORAL | Status: AC
  Administered 2017-11-30: 30 mL via ORAL
  Filled 2017-11-30: qty 30

## 2017-11-30 MED ORDER — PHENOL 1.4 % MT LIQD: 1.0000 | OROMUCOSAL | 0 refills | Status: AC | PRN

## 2017-11-30 MED ORDER — BENZONATATE 100 MG PO CAPS
100.0000 mg | ORAL_CAPSULE | Freq: Once | ORAL | Status: AC
  Administered 2017-11-30: 100 mg via ORAL
  Filled 2017-11-30: qty 1

## 2017-11-30 MED ORDER — KETOROLAC TROMETHAMINE 30 MG/ML IJ SOLN
30.0000 mg | Freq: Once | INTRAMUSCULAR | Status: AC
  Administered 2017-11-30: 30 mg via INTRAVENOUS
  Filled 2017-11-30: qty 1

## 2017-11-30 NOTE — ED Provider Notes (Signed)
[facility] EMERGENCY DEPARTMENT Provider Note   CSN: [id number] Arrival date & time: 11/29/17  2031    History   Chief Complaint Chief Complaint  Patient presents with  . Sore Throat    HPI Rita Gill is a 27 y.o. female.  27 year old female presents to the emergency department for evaluation of upper respiratory symptoms.  She has had 3 days of nasal congestion with postnasal drip and sore throat.  She developed a dry cough today.  Patient with subjective fever and chills which have been intermittent.  She took Alka-Seltzer for her symptoms without relief.  She notes a global headache prior to arrival which improved following Tylenol.  She also had a fever of 102.3 F in triage.  Patient reports one sick coworker, but denies any other known sick contacts.  No inability to swallow, drooling, vomiting, shortness of breath.     History reviewed. No pertinent past medical history.  There are no active problems to display for this patient.   History reviewed. No pertinent surgical history.   OB History   None      Home Medications    Prior to Admission medications   Medication Sig Start Date End Date Taking? Authorizing Provider  acetaminophen (TYLENOL) 500 MG tablet Take 1,000 mg by mouth every 6 (six) hours as needed for mild pain.   Yes [provider]  ibuprofen (CHILDRENS IBUPROFEN) 100 MG/5ML suspension Take 30 mLs (600 mg total) by mouth every 6 (six) hours as needed for fever, mild pain or moderate pain. 11/30/17   Antony Madura, PA-C  phenol (CHLORASEPTIC) 1.4 % LIQD Use as directed 1 spray in the mouth or throat as needed for throat irritation / pain. 11/30/17   Antony Madura, PA-C    Family History No family history on file.  Social History Social History   Tobacco Use  . Smoking status: Never Smoker  . Smokeless tobacco: Never Used  Substance Use Topics  . Alcohol use: No  . Drug use: Never     Allergies   Patient has no  known allergies.   Review of Systems Review of Systems Ten systems reviewed and are negative for acute change, except as noted in the HPI.    Physical Exam Updated Vital Signs BP 123/83   Pulse (!) 109   Temp 98.4 F (36.9 C) (Oral)   Resp 18   Ht 5\' 2"  (1.575 m)   Wt 77.1 kg (170 lb)   SpO2 100%   BMI 31.09 kg/m   Physical Exam  Constitutional: She is oriented to person, place, and time. She appears well-developed and well-nourished. No distress.  Nontoxic appearing and in NAD  HENT:  Head: Normocephalic and atraumatic.  Mild posterior oropharyngeal erythema without edema.  No significant tonsillar enlargement.  Uvula midline.  No tripoding or stridor.  Patient tolerating secretions without difficulty.  Eyes: Conjunctivae and EOM are normal. No scleral icterus.  Neck: Normal range of motion.  No meningismus  Cardiovascular: Normal rate, regular rhythm and intact distal pulses.  Pulmonary/Chest: Effort normal. No stridor. No respiratory distress. She has no wheezes.  Respirations even and unlabored.  Lungs clear to auscultation bilaterally.  Intermittent dry cough.  Musculoskeletal: Normal range of motion.  Neurological: She is alert and oriented to person, place, and time. She exhibits normal muscle tone. Coordination normal.  Skin: Skin is warm and dry. No rash noted. She is not diaphoretic. No erythema. No pallor.  Psychiatric: She has a normal mood  and affect. Her behavior is normal.  Nursing note and vitals reviewed.    ED Treatments / Results  Labs (all labs ordered are listed, but only abnormal results are displayed) Labs Reviewed  CBC - Abnormal; Notable for the following components:      Result Value   Hemoglobin 8.2 (*)    HCT 29.3 (*)    MCV 65.8 (*)    MCH 18.4 (*)    MCHC 28.0 (*)    RDW 19.2 (*)    Platelets 477 (*)    All other components within normal limits  BASIC METABOLIC PANEL - Abnormal; Notable for the following components:   Sodium 132  (*)    Chloride 98 (*)    BUN <5 (*)    All other components within normal limits  GROUP A STREP BY PCR    EKG None  Radiology No results found.  Procedures Procedures (including critical care time)  Medications Ordered in ED Medications  acetaminophen (TYLENOL) tablet 650 mg (650 mg Oral Given 11/29/17 2105)  ketorolac (TORADOL) 30 MG/ML injection 30 mg (30 mg Intravenous Given 11/30/17 0204)  gi cocktail (Maalox,Lidocaine,Donnatal) (30 mLs Oral Given 11/30/17 0204)  benzonatate (TESSALON) capsule 100 mg (100 mg Oral Given 11/30/17 0204)     Initial Impression / Assessment and Plan / ED Course  I have reviewed the triage vital signs and the nursing notes.  Pertinent labs & imaging results that were available during my care of the patient were reviewed by me and considered in my medical decision making (see chart for details).     Patient complaining of symptoms of upper respiratory infection. Febrile in triage, responding to antipyretics. Mild to moderate symptoms of nasal congestion, PND, and sore throat with cough for less than 10 days.  Labs show known anemia, slightly below baseline. Negative strep screen. Suspect viral etiology.  Patient discharged with symptomatic treatment.  Encouraged PCP follow up.  Return precautions discussed and provided. Patient discharged in stable condition with no unaddressed concerns.   Final Clinical Impressions(s) / ED Diagnoses   Final diagnoses:  Febrile illness  Acute URI  Chronic anemia    ED Discharge Orders        Ordered    ibuprofen (CHILDRENS IBUPROFEN) 100 MG/5ML suspension  Every 6 hours PRN     11/30/17 0257    phenol (CHLORASEPTIC) 1.4 % LIQD  As needed     11/30/17 0257       Antony MaduraHumes, Khale Nigh, PA-C 11/30/17 0302    Cardama, Amadeo GarnetPedro Eduardo, MD 11/30/17 [phone]

## 2017-11-30 NOTE — Discharge Instructions (Signed)
Your hemoglobin was found to be low today.  You have a history of anemia.  Have this rechecked by your primary care doctor within 1 week.    We recommend Tylenol every 6-8 hours for fever management.  Take ibuprofen for management of your sore throat as well as headaches and persistent fever.  You may use Chloraseptic spray for sore throat.  Continue with over-the-counter medications for cough and congestion.  Drink plenty of fluids to prevent dehydration.  Follow-up with your primary care doctor to ensure resolution of symptoms.

## 2018-01-01 DIAGNOSIS — Z0000 Encounter for general adult medical examination without abnormal findings: Secondary | ICD-10-CM | POA: Diagnosis not present

## 2018-01-01 DIAGNOSIS — Z13220 Encounter for screening for lipoid disorders: Secondary | ICD-10-CM | POA: Diagnosis not present

## 2019-02-18 ENCOUNTER — Other Ambulatory Visit: Payer: Self-pay | Admitting: Family Medicine

## 2019-02-18 ENCOUNTER — Other Ambulatory Visit (HOSPITAL_COMMUNITY)
Admission: RE | Admit: 2019-02-18 | Discharge: 2019-02-18 | Disposition: A | Discharge status: Home / Self Care | Payer: 59 | Source: Ambulatory Visit | Attending: Family Medicine | Admitting: Family Medicine

## 2019-02-18 DIAGNOSIS — Z01411 Encounter for gynecological examination (general) (routine) with abnormal findings: Secondary | ICD-10-CM | POA: Insufficient documentation

## 2019-02-19 LAB — CYTOLOGY - PAP
Diagnosis: NEGATIVE
HPV: NOT DETECTED

## 2021-10-14 ENCOUNTER — Other Ambulatory Visit: Payer: Self-pay | Admitting: Nurse Practitioner

## 2021-10-14 ENCOUNTER — Other Ambulatory Visit (HOSPITAL_COMMUNITY)
Admission: RE | Admit: 2021-10-14 | Discharge: 2021-10-14 | Disposition: A | Discharge status: Home / Self Care | Payer: 59 | Source: Ambulatory Visit | Attending: Nurse Practitioner | Admitting: Nurse Practitioner

## 2021-10-14 DIAGNOSIS — Z124 Encounter for screening for malignant neoplasm of cervix: Secondary | ICD-10-CM | POA: Insufficient documentation

## 2021-10-20 LAB — CYTOLOGY - PAP
Comment: NEGATIVE
Diagnosis: NEGATIVE
High risk HPV: NEGATIVE
